# Patient Record
Sex: Female | Born: 1995 | Race: Black or African American | Hispanic: No | Marital: Single | Smoking: Never smoker
Health system: Southern US, Community
[De-identification: ages and names within clinical notes are randomized; demographics above are authoritative.]

## PROBLEM LIST (undated history)

## (undated) DIAGNOSIS — G43909 Migraine, unspecified, not intractable, without status migrainosus: Secondary | ICD-10-CM

## (undated) DIAGNOSIS — G510 Bell's palsy: Secondary | ICD-10-CM

## (undated) HISTORY — DX: Migraine, unspecified, not intractable, without status migrainosus: G43.909

## (undated) HISTORY — DX: Bell's palsy: G51.0

## (undated) HISTORY — PX: MANDIBLE SURGERY: SHX707

---
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
## 2017-06-25 ENCOUNTER — Ambulatory Visit: Payer: Self-pay | Admitting: Family Medicine

## 2017-06-25 NOTE — Progress Notes (Deleted)
  No chief complaint on file.   HPI  4 review of systems  No past medical history on file.  No current outpatient medications on file.   No current facility-administered medications for this visit.     Allergies: Allergies not on file  *** The histories are not reviewed yet. Please review them in the "History" navigator section and refresh this SmartLink.  Social History   Socioeconomic History  . Marital status: Single    Spouse name: Not on file  . Number of children: Not on file  . Years of education: Not on file  . Highest education level: Not on file  Social Needs  . Financial resource strain: Not on file  . Food insecurity - worry: Not on file  . Food insecurity - inability: Not on file  . Transportation needs - medical: Not on file  . Transportation needs - non-medical: Not on file  Occupational History  . Not on file  Tobacco Use  . Smoking status: Not on file  Substance and Sexual Activity  . Alcohol use: Not on file  . Drug use: Not on file  . Sexual activity: Not on file  Other Topics Concern  . Not on file  Social History Narrative  . Not on file    No family history on file.   ROS Review of Systems See HPI Constitution: No fevers or chills No malaise No diaphoresis Skin: No rash or itching Eyes: no blurry vision, no double vision GU: no dysuria or hematuria Neuro: no dizziness or headaches * all others reviewed and negative   Objective: There were no vitals filed for this visit.  Physical Exam  Assessment and Plan There are no diagnoses linked to this encounter.   Laura Potter 

## 2017-06-28 ENCOUNTER — Ambulatory Visit: Payer: Self-pay | Admitting: Family Medicine

## 2018-03-27 ENCOUNTER — Ambulatory Visit (INDEPENDENT_AMBULATORY_CARE_PROVIDER_SITE_OTHER): Payer: BLUE CROSS/BLUE SHIELD | Admitting: Neurology

## 2018-03-27 ENCOUNTER — Encounter: Payer: Self-pay | Admitting: Neurology

## 2018-03-27 VITALS — BP 132/90 | HR 75 | Ht 73.0 in | Wt 263.0 lb

## 2018-03-27 DIAGNOSIS — R5382 Chronic fatigue, unspecified: Secondary | ICD-10-CM

## 2018-03-27 DIAGNOSIS — R29810 Facial weakness: Secondary | ICD-10-CM | POA: Diagnosis not present

## 2018-03-27 DIAGNOSIS — G519 Disorder of facial nerve, unspecified: Secondary | ICD-10-CM

## 2018-03-27 DIAGNOSIS — G510 Bell's palsy: Secondary | ICD-10-CM | POA: Diagnosis not present

## 2018-03-27 NOTE — Progress Notes (Signed)
[facility]    Provider:  Dr Lucia Gaskins Referring Provider: Jodell Cipro, NP Primary Care Physician:  Jodell Cipro, NP  CC:  Recurrent bells palsy  HPI:  Laura Potter is a 22 y.o. female here as requested by Dr. Darrelyn Hillock for Bell's palsy. The first time she looked in the mirror and her face wasn't moving on the right side 2017. Treated with prednisone and lasted 6 weeks. 2nd time in 2018 she had it on the left side. Diagnosed and treated with steroids and lasted about 6 weeks. On September 2nd she was brushing her teeth and her tongue felt numb, again had Bell's Palsy on the right side. Before the incidents, she felt rundown but always run down, she works full time and is a Consulting civil engineer, no known illnesses this time, she has decreased taste, no hearing difficulties, she is having dry eye on the right. But she can close the right eye she doesn't have to wear a patch. The first 2 she felt recovered, this is taking longer to resolve, she took short-term disability from work and she is stressed out. She massages her face. No FHx of autoimmune disorders or neurologic disorders. She never gets mouth blisters.   Reviewed notes, labs and imaging from outside physicians, which showed:  Will request notes from pcp, not provided   Review of Systems: Patient complains of symptoms per HPI as well as the following symptoms: depression, racing thoughts. Pertinent negatives and positives per HPI. All others negative.   Social History   Socioeconomic History  . Marital status: Single    Spouse name: Not on file  . Number of children: 0  . Years of education: 60  . Highest education level: Some college, no degree  Occupational History  . Not on file  Social Needs  . Financial resource strain: Not on file  . Food insecurity:    Worry: Not on file    Inability: Not on file  . Transportation needs:    Medical: Not on file    Non-medical: Not on file  Tobacco Use  . Smoking  status: Never Smoker  . Smokeless tobacco: Never Used  Substance and Sexual Activity  . Alcohol use: Yes    Alcohol/week: 3.0 - 4.0 standard drinks    Types: 3 - 4 Glasses of wine per week  . Drug use: Yes    Frequency: 7.0 times per week    Types: Marijuana  . Sexual activity: Yes    Partners: Female    Birth control/protection: None  Lifestyle  . Physical activity:    Days per week: Not on file    Minutes per session: Not on file  . Stress: Not on file  Relationships  . Social connections:    Talks on phone: Not on file    Gets together: Not on file    Attends religious service: Not on file    Active member of club or organization: Not on file    Attends meetings of clubs or organizations: Not on file    Relationship status: Not on file  . Intimate partner violence:    Fear of current or ex partner: Not on file    Emotionally abused: Not on file    Physically abused: Not on file    Forced sexual activity: Not on file  Other Topics Concern  . Not on file  Social History Narrative   Lives at home with her rabbit pickles   Right handed   Caffeine:  rarely    Family History  Problem Relation Age of Onset  . Diabetes Mother   . High blood pressure Mother   . High blood pressure Father   . High blood pressure Maternal Grandmother   . Breast cancer Maternal Grandmother   . Stroke Maternal Grandmother   . Diabetes Maternal Grandfather   . High blood pressure Maternal Grandfather   . Diabetes Paternal Grandmother   . High blood pressure Paternal Grandmother   . Stroke Paternal Grandmother   . Migraines Paternal Grandmother   . Diabetes Paternal Grandfather   . High blood pressure Paternal Grandfather   . Breast cancer Other   . Bell's palsy Neg Hx     Past Medical History:  Diagnosis Date  . Bell's palsy   . Migraines     Past Surgical History:  Procedure Laterality Date  . MANDIBLE SURGERY     for underbite    No current outpatient medications on file.     No current facility-administered medications for this visit.     Allergies as of 03/27/2018  . (Not on File)    Vitals: BP 132/90 (BP Location: Right Arm, Patient Position: Sitting)   Pulse 75   Ht 6\' 1"  (1.854 m)   Wt 263 lb (119.3 kg)   LMP 02/23/2018 (Within Days)   BMI 34.70 kg/m  Last Weight:  Wt Readings from Last 1 Encounters:  03/27/18 263 lb (119.3 kg)   Last Height:   Ht Readings from Last 1 Encounters:  03/27/18 6\' 1"  (1.854 m)   Physical exam: Exam: Gen: NAD, conversant, well nourised, obese, well groomed                     CV: RRR, no MRG. No Carotid Bruits. No peripheral edema, warm, nontender Eyes: Conjunctivae clear without exudates or hemorrhage  Neuro: Detailed Neurologic Exam  Speech:    Speech is normal; fluent and spontaneous with normal comprehension.  Cognition:    The patient is oriented to person, place, and time;     recent and remote memory intact;     language fluent;     normal attention, concentration,     fund of knowledge Cranial Nerves:    The pupils are equal, round, and reactive to light. The fundi are normal and spontaneous venous pulsations are present. Visual fields are full to finger confrontation. Extraocular movements are intact. Trigeminal sensation is intact and the muscles of mastication are normal. Decreased blink on the right, mild right facial palsy and mild right decrease brow lift, retracted right lid. Decreased right eye closure. The palate elevates in the midline. Hearing intact. Voice is normal. Shoulder shrug is normal. The tongue has normal motion without fasciculations.   Coordination:    Normal finger to nose and heel to shin. Normal rapid alternating movements.   Gait:    Heel-toe and tandem gait are normal.   Motor Observation:    No asymmetry, no atrophy, and no involuntary movements noted. Tone:    Normal muscle tone.    Posture:    Posture is normal. normal erect    Strength:    Strength is V/V  in the upper and lower limbs.      Sensation: intact to LT     Reflex Exam:  DTR's:    Deep tendon reflexes in the upper and lower extremities are normal bilaterally.   Toes:    The toes are downgoing bilaterally.   Clonus:  Clonus is absent.       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 Bells palsy 3 time in 3 years, currently on the right but has been on the left in the past.   Very unusual case.  Bell's palsy recurs in less than 10% of patients in this patient has had Bell's palsy 3 times in 3 years.  No family history of Bell's palsy.  Causes that predisposed to recurrence of idiopathic facial palsy or not well known however associated with causes such as malignant hypertension, diabetes, autoimmune disorders, pregnancy, fibrous dysplasia, Melkerson Rosenthal syndrome. Will order MRI of the brain w/wo contrast to evaluate for causes and lab testing as well.   Orders Placed This Encounter  Procedures  . MR BRAIN W WO CONTRAST  . Angiotensin converting enzyme  . HIV Antibody (routine testing w rflx)  . RPR  . B. burgdorfi Antibody  . ANA, IFA (with reflex)  . Hemoglobin A1c  . Sjogren's syndrome antibods(ssa + ssb)  . HSV(herpes simplex vrs) 1+2 ab-IgG  . CBC  . Comprehensive metabolic panel  . TSH   RTC 3 months  Cc: Afatsawo, Christinia Gully, NP  Naomie Dean, MD  [facility] [address] [place], [place] [address]  Phone [phone] Fax [phone]

## 2018-03-27 NOTE — Patient Instructions (Signed)
MRI of the brain Blood work today Return in 2-3 months   Bell Palsy, Adult Bell palsy is a short-term inability to move muscles in part of the face. The inability to move (paralysis) results from inflammation or compression of the facial nerve, which travels along the skull and under the ear to the side of the face (7th cranial nerve). This nerve is responsible for facial movements that include blinking, closing the eyes, smiling, and frowning. What are the causes? The exact cause of this condition is not known. It may be caused by an infection from a virus, such as the chickenpox (herpes zoster), Epstein-Barr, or mumps virus. What increases the risk? You are more likely to develop this condition if:  You are pregnant.  You have diabetes.  You have had a recent infection in your nose, throat, or airways (upper respiratory infection).  You have a weakened body defense system (immune system).  You have had a facial injury, such as a fracture.  You have a family history of Bell palsy.  What are the signs or symptoms? Symptoms of this condition include:  Weakness on one side of the face.  Drooping eyelid and corner of the mouth.  Excessive tearing in one eye.  Difficulty closing the eyelid.  Dry eye.  Drooling.  Dry mouth.  Changes in taste.  Change in facial appearance.  Pain behind one ear.  Ringing in one or both ears.  Sensitivity to sound in one ear.  Facial twitching.  Headache.  Impaired speech.  Dizziness.  Difficulty eating or drinking.  Most of the time, only one side of the face is affected. Rarely, Bell palsy affects the whole face. How is this diagnosed? This condition is diagnosed based on:  Your symptoms.  Your medical history.  A physical exam.  You may also have to see health care providers who specialize in disorders of the nerves (neurologist) or diseases and conditions of the eye (ophthalmologist). You may have tests, such  as:  A test to check for nerve damage (electromyogram).  Imaging studies, such as CT or MRI scans.  Blood tests.  How is this treated? This condition affects every person differently. Sometimes symptoms go away without treatment within a couple weeks. If treatment is needed, it varies from person to person. The goal of treatment is to reduce inflammation and protect the eye from damage. Treatment for Bell palsy may include:  Medicines, such as: ? Steroids to reduce swelling and inflammation. ? Antiviral drugs. ? Pain relievers, including aspirin, acetaminophen, or ibuprofen.  Eye drops or ointment to keep your eye moist.  Eye protection, if you cannot close your eye.  Exercises or massage to regain muscle strength and function (physical therapy).  Follow these instructions at home:  Take over-the-counter and prescription medicines only as told by your health care provider.  If your eye is affected: ? Keep your eye moist with eye drops or ointment as told by your health care provider. ? Follow instructions for eye care and protection as told by your health care provider.  Do any physical therapy exercises as told by your health care provider.  Keep all follow-up visits as told by your health care provider. This is important. Contact a health care provider if:  You have a fever.  Your symptoms do not get better within 2-3 weeks, or your symptoms get worse.  Your eye is red, irritated, or painful.  You have new symptoms. Get help right away if:  You have  weakness or numbness in a part of your body other than your face.  You have trouble swallowing.  You develop neck pain or stiffness.  You develop dizziness or shortness of breath. Summary  Bell palsy is a short-term inability to move muscles in part of the face. The inability to move (paralysis) results from inflammation or compression of the facial nerve.  This condition affects every person differently. Sometimes  symptoms go away without treatment within a couple weeks.  If treatment is needed, it varies from person to person. The goal of treatment is to reduce inflammation and protect the eye from damage.  Contact your health care provider if your symptoms do not get better within 2-3 weeks, or your symptoms get worse. This information is not intended to replace advice given to you by your health care provider. Make sure you discuss any questions you have with your health care provider. Document Released: 05/01/2005 Document Revised: 07/04/2016 Document Reviewed: 07/04/2016 Elsevier Interactive Patient Education  [employer].

## 2018-03-28 ENCOUNTER — Encounter: Payer: Self-pay | Admitting: Neurology

## 2018-03-28 ENCOUNTER — Telehealth: Payer: Self-pay | Admitting: Neurology

## 2018-03-28 DIAGNOSIS — G510 Bell's palsy: Secondary | ICD-10-CM | POA: Insufficient documentation

## 2018-03-28 NOTE — Telephone Encounter (Signed)
unable to lvm the phone number it did not have a vmail box  [employer] Auth: [id number] (exp. 03/27/18 to 04/25/18)

## 2018-03-29 LAB — CBC
Hematocrit: 45.4 % (ref 34.0–46.6)
Hemoglobin: 15.1 g/dL (ref 11.1–15.9)
MCH: 28 pg (ref 26.6–33.0)
MCHC: 33.3 g/dL (ref 31.5–35.7)
MCV: 84 fL (ref 79–97)
PLATELETS: 311 10*3/uL (ref 150–450)
RBC: 5.4 x10E6/uL — AB (ref 3.77–5.28)
RDW: 14.3 % (ref 12.3–15.4)
WBC: 7.8 10*3/uL (ref 3.4–10.8)

## 2018-03-29 LAB — B. BURGDORFI ANTIBODIES

## 2018-03-29 LAB — COMPREHENSIVE METABOLIC PANEL
ALT: 124 IU/L — AB (ref 0–32)
AST: 63 IU/L — ABNORMAL HIGH (ref 0–40)
Albumin/Globulin Ratio: 2.1 (ref 1.2–2.2)
Albumin: 4.9 g/dL (ref 3.5–5.5)
Alkaline Phosphatase: 62 IU/L (ref 39–117)
BUN / CREAT RATIO: 10 (ref 9–23)
BUN: 8 mg/dL (ref 6–20)
Bilirubin Total: 1 mg/dL (ref 0.0–1.2)
CO2: 19 mmol/L — ABNORMAL LOW (ref 20–29)
Calcium: 9.9 mg/dL (ref 8.7–10.2)
Chloride: 108 mmol/L — ABNORMAL HIGH (ref 96–106)
Creatinine, Ser: 0.77 mg/dL (ref 0.57–1.00)
GFR calc non Af Amer: 108 mL/min/{1.73_m2} (ref >59)
GFR, EST AFRICAN AMERICAN: 125 mL/min/{1.73_m2} (ref >59)
Globulin, Total: 2.3 g/dL (ref 1.5–4.5)
Glucose: 62 mg/dL — ABNORMAL LOW (ref 65–99)
POTASSIUM: 4.5 mmol/L (ref 3.5–5.2)
Sodium: 144 mmol/L (ref 134–144)
TOTAL PROTEIN: 7.2 g/dL (ref 6.0–8.5)

## 2018-03-29 LAB — SJOGREN'S SYNDROME ANTIBODS(SSA + SSB)
ENA SSA (RO) Ab: 0.7 AI (ref 0.0–0.9)
ENA SSB (LA) Ab: 5.8 AI — ABNORMAL HIGH (ref 0.0–0.9)

## 2018-03-29 LAB — HSV(HERPES SIMPLEX VRS) I + II AB-IGG
HSV 1 Glycoprotein G Ab, IgG: <0.91 index (ref 0.00–0.90)
HSV 2 IgG, Type Spec: 11 index — ABNORMAL HIGH (ref 0.00–0.90)

## 2018-03-29 LAB — ANTINUCLEAR ANTIBODIES, IFA: ANA Titer 1: POSITIVE — AB

## 2018-03-29 LAB — RPR: RPR Ser Ql: NONREACTIVE

## 2018-03-29 LAB — FANA STAINING PATTERNS

## 2018-03-29 LAB — ANGIOTENSIN CONVERTING ENZYME: Angio Convert Enzyme: 24 U/L (ref 14–82)

## 2018-03-29 LAB — HEMOGLOBIN A1C
Est. average glucose Bld gHb Est-mCnc: 108 mg/dL
Hgb A1c MFr Bld: 5.4 % (ref 4.8–5.6)

## 2018-03-29 LAB — TSH: TSH: 1.09 u[IU]/mL (ref 0.450–4.500)

## 2018-03-29 LAB — HIV ANTIBODY (ROUTINE TESTING W REFLEX): HIV SCREEN 4TH GENERATION: NONREACTIVE

## 2018-04-01 ENCOUNTER — Other Ambulatory Visit: Payer: Self-pay | Admitting: *Deleted

## 2018-04-01 ENCOUNTER — Telehealth: Payer: Self-pay | Admitting: Neurology

## 2018-04-01 DIAGNOSIS — G510 Bell's palsy: Secondary | ICD-10-CM

## 2018-04-01 DIAGNOSIS — R768 Other specified abnormal immunological findings in serum: Secondary | ICD-10-CM

## 2018-04-01 NOTE — Telephone Encounter (Signed)
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 I discussed this with rheumatology and Dr. Corliss Skainseveshwar has agreed to see patient.  If paiemt agrees then place a referral to shali deveshwar for ana positive and bells palsy.  Also, she needs to see a primary care physician within 4-8 weeks or around there as her liver enzymes are elevated, she needs a thorough evaluation and she should stop any excessive tylenol use or cut back on alcohol use or anything that affects the liver.    Also, she has not scheuled her MRI of the brain yet. Ask if there has been an issue with that, I highly recommend it. Thanks.  Dr. Lucia GaskinsAhern

## 2018-04-01 NOTE — Telephone Encounter (Signed)
Rheumatology referral placed

## 2018-04-01 NOTE — Telephone Encounter (Signed)
Called pt and discussed all of the results and suggestions from Dr. Lucia GaskinsAhern in detail. Pt verbalized understanding and her questions were answered. She agreed to the referral. She is aware that referrals has tried to reach out to schedule MRI. Her phone was off but is now. Unable to schedule pt right now but [facility] Pt verbalized appreciation.

## 2018-04-02 NOTE — Telephone Encounter (Signed)
Spoke to the patient she is scheduled for 04/09/18 at [facility].  °

## 2018-04-02 NOTE — Telephone Encounter (Signed)
[facility] to the patient [facility]

## 2018-04-04 NOTE — Progress Notes (Signed)
Office Visit Note  Patient: Laura Potter             Date of Birth: 03/14/1996           MRN: [id number]             PCP: Jodell Cipro, NP Referring: Anson Fret, MD Visit Date: 04/17/2018 Occupation: [employer], [employer]  Subjective:  Recurrent Bell's palsy.   History of Present Illness: Laura Potter is a 22 y.o. female seen in consultation per request of Dr. Daisy Blossom.  According to patient her symptoms are started in June 2017 with right-sided Bell's palsy.  She states she initially was seen at the emergency room where she was given prednisone and the symptoms lasted for about 6 weeks.  A year later she had recurrence of symptoms on the left side with left-sided Bell's palsy at the time she did not see a physician and the symptoms resolved after 8 weeks.  On January 15, 2018 she states she started experiencing some paresthesias in her tongue and then eventually developed right-sided Bell's palsy.  She went to her PCP and was given prednisone and Vancyclovir.  She was also referred to Dr. Daisy Blossom.  Patient states Dr. Daisy Blossom did lab work and MRI of her brain.  The MRI of the brain was unremarkable.  Her labs came positive for ANA low titer and positive SSB.  Denies any history of dry mouth or dry eyes.  There is no history of rainouts phenomenon there is no history of joint pain.  She states she has some knee joint discomfort which she relates to her weight.  Activities of Daily Living:  Patient reports morning stiffness for 0 minute.   Patient Denies nocturnal pain.  Difficulty dressing/grooming: Denies Difficulty climbing stairs: Denies Difficulty getting out of chair: Denies Difficulty using hands for taps, buttons, cutlery, and/or writing: Denies  Review of Systems  Constitutional: Negative for fatigue, night sweats, weight gain and weight loss.  HENT: Negative for mouth sores, trouble swallowing, trouble swallowing, mouth dryness and nose  dryness.   Eyes: Negative for pain, redness, visual disturbance and dryness.  Respiratory: Negative for cough, shortness of breath and difficulty breathing.   Cardiovascular: Negative for chest pain, palpitations, hypertension, irregular heartbeat and swelling in legs/feet.  Gastrointestinal: Negative for blood in stool, constipation and diarrhea.  Endocrine: Negative for increased urination.  Genitourinary: Negative for vaginal dryness.  Musculoskeletal: Negative for arthralgias, joint pain, joint swelling, myalgias, muscle weakness, morning stiffness, muscle tenderness and myalgias.  Skin: Negative for color change, rash, hair loss, skin tightness, ulcers and sensitivity to sunlight.  Allergic/Immunologic: Negative for susceptible to infections.  Neurological: Positive for headaches. Negative for dizziness, memory loss, night sweats and weakness.       History of occasional migraines.  Hematological: Negative for swollen glands.  Psychiatric/Behavioral: Negative for depressed mood and sleep disturbance. The patient is nervous/anxious.     PMFS History:  Patient Active Problem List   Diagnosis Date Noted  . Hx of migraines 04/17/2018  . Facial paralysis/Bells palsy 03/28/2018    Past Medical History:  Diagnosis Date  . Bell's palsy   . Migraines     Family History  Problem Relation Age of Onset  . Diabetes Mother   . High blood pressure Mother   . High blood pressure Father   . High blood pressure Maternal Grandmother   . Breast cancer Maternal Grandmother   . Stroke Maternal Grandmother   . Diabetes  Maternal Grandfather   . High blood pressure Maternal Grandfather   . Diabetes Paternal Grandmother   . High blood pressure Paternal Grandmother   . Stroke Paternal Grandmother   . Migraines Paternal Grandmother   . Diabetes Paternal Grandfather   . High blood pressure Paternal Grandfather   . Breast cancer Other   . Healthy Brother   . Asthma Brother   . Healthy Brother     . Healthy Brother   . Bell's palsy Neg Hx    Past Surgical History:  Procedure Laterality Date  . MANDIBLE SURGERY     for underbite  . WISDOM TOOTH EXTRACTION  2012   Social History   Social History Narrative   Lives at home with her rabbit pickles   Right handed   Caffeine: rarely    Objective: Vital Signs: BP (!) 134/92 (BP Location: Left Arm, Patient Position: Sitting, Cuff Size: Normal)   Pulse 84   Resp 12   Ht 6\' 1"  (1.854 m)   Wt 259 lb (117.5 kg)   BMI 34.17 kg/m    Physical Exam  Constitutional: She is oriented to person, place, and time. She appears well-developed and well-nourished.  HENT:  Head: Normocephalic and atraumatic.  Eyes: Conjunctivae and EOM are normal.  Neck: Normal range of motion.  Cardiovascular: Normal rate, regular rhythm, normal heart sounds and intact distal pulses.  Pulmonary/Chest: Effort normal and breath sounds normal.  Abdominal: Soft. Bowel sounds are normal.  Lymphadenopathy:    She has no cervical adenopathy.  Neurological: She is alert and oriented to person, place, and time. A cranial nerve deficit is present.  Right sided Bell's palsy  Skin: Skin is warm and dry. Capillary refill takes less than 2 seconds.  Psychiatric: She has a normal mood and affect. Her behavior is normal.  Nursing note and vitals reviewed.    Musculoskeletal Exam: C-spine thoracic lumbar spine good range of motion.  Shoulder joints elbow joints wrist joint MCPs PIPs DIPs been good range of motion with no synovitis.  Hip joints knee joints ankles MTPs PIPs were in good range of motion with no synovitis.  CDAI Exam: CDAI Score: Not documented Patient Global Assessment: Not documented; Provider Global Assessment: Not documented Swollen: Not documented; Tender: Not documented Joint Exam   Not documented   There is currently no information documented on the homunculus. Go to the Rheumatology activity and complete the homunculus joint  exam.  Investigation: Findings:  03/27/18: ANA 1:160 speckled, Ro-, La 5.8, ACE 24, RPR-, HIV-, Lyme ab-, TSH 1.090, HSV1 -, HSV2 11+, HgbA1c 5.4  Component     Latest Ref Rng & Units 03/27/2018  Hemoglobin A1C     4.8 - 5.6 % 5.4  Est. average glucose Bld gHb Est-mCnc     mg/dL 604  ENA SSA (RO) Ab     0.0 - 0.9 AI 0.7  ENA SSB (LA) Ab     0.0 - 0.9 AI 5.8 (H)  HSV 1 Glycoprotein G Ab, IgG     0.00 - 0.90 index <0.91  HSV 2 IGG,TYPE SPECIFIC AB     0.00 - 0.90 index 11.00 (H)  Angio Convert Enzyme     14 - 82 U/L 24  HIV Screen 4th Generation wRfx     Non Reactive Non Reactive  RPR     Non Reactive Non Reactive  Lyme IgG/IgM Ab     0.00 - 0.90 ISR <0.91  ANA Titer 1  Positive (A)   Ima Component     Latest Ref Rng & Units 03/27/2018  Speckled Pattern      1:160 (H)  NOTE:      Comment  TSH     0.450 - 4.500 uIU/mL 1.090  ging: Mr Laqueta Jean Wo Contrast  Result Date: 04/09/2018  [facility] [address], [address] [place], [place] [address] [phone] NEUROIMAGING REPORT STUDY DATE: 04/09/2018 PATIENT NAME: Allona Gondek DOB: 03-03-1996 MRN: [id number] EXAM: MRI Brain with and without contrast ORDERING CLINICIAN: Naomie Dean, MD CLINICAL HISTORY: 22 year old woman with recurrent Bell's palsy currently on the right COMPARISON FILMS: None TECHNIQUE:MRI of the brain with and without contrast was obtained utilizing 5 mm axial slices with T1, T2, T2 flair, heme weighted and diffusion weighted views.  T1 sagittal, T2 coronal and postcontrast views in the axial and coronal plane were obtained.  Additional thin section axial CISS, axial T1, coronal T1 and postcontrast axial and coronal T1-weighted images were performed through the internal auditory canals. CONTRAST: 20 ml Multihance IMAGING SITE: [facility], [address], [place], [place] [address] FINDINGS: On sagittal images, the spinal cord is imaged caudally to C3 and is normal in  caliber.   The contents of the posterior fossa are of normal size and position.   The pituitary gland and optic chiasm appear normal.    Brain volume appears normal.   The ventricles are normal in size and without distortion.  There are no abnormal extra-axial collections of fluid.  The cerebellum and brainstem appears normal.   The deep gray matter appears normal.  The cerebral hemispheres appear normal.   Diffusion weighted images are normal.  Heme weighted images are normal.   The orbits appear normal.   The VIIth/VIIIth nerve complex appears normal.  The mastoid air cells appear normal.  There is a small mucous retention cyst in the right maxillary sinus.  The other paranasal sinuses appear normal.  Flow voids are identified within the major intracerebral arteries.   Cerebral veins and dural sinuses appear normal. After the infusion of contrast material, a normal enhancement pattern is noted.   This is a normal MRI of the brain with and without contrast with added attention to the internal auditory canals INTERPRETING PHYSICIAN: Richard A. Epimenio Foot, MD, PhD, FAAN Certified in  Neuroimaging by [employer]    Recent Labs: Lab Results  Component Value Date   WBC 7.8 03/27/2018   HGB 15.1 03/27/2018   PLT 311 03/27/2018   NA 144 03/27/2018   K 4.5 03/27/2018   CL 108 (H) 03/27/2018   CO2 19 (L) 03/27/2018   GLUCOSE 62 (L) 03/27/2018   BUN 8 03/27/2018   CREATININE 0.77 03/27/2018   BILITOT 1.0 03/27/2018   ALKPHOS 62 03/27/2018   AST 63 (H) 03/27/2018   ALT 124 (H) 03/27/2018   PROT 7.2 03/27/2018   ALBUMIN 4.9 03/27/2018   CALCIUM 9.9 03/27/2018   GFRAA 125 03/27/2018    Speciality Comments: No specialty comments available.  Procedures:  No procedures performed Allergies: Patient has no known allergies.   Assessment / Plan:     Visit Diagnoses: Positive ANA (antinuclear antibody) -patient has positive ANA and positive SSB antibody.  She has no sicca symptoms.   There is no history of Raynolds or any other autoimmune symptoms.  I would like to repeat her labs at the follow-up visit.  03/27/18: ANA 1:160 speckled, Ro-, La 5.8, ACE 24, RPR-, HIV-, Lyme ab-,  TSH 1.090, HSV1 -, HSV2 11+, HgbA1c 5.4.  I would like to obtainAVISE labs at her follow-up visit.  Facial paralysis/Bells palsy-patient had one episode of Bell's palsy once a year x3 so far.  Each episode responded to the prednisone and acyclovir.  Am uncertain if her positive ANA and positive SSB antibodies are contributing to Bell's palsy.  She has no parotid swelling.  I will try to contact Dr. Lucia GaskinsAhern and discuss this further with her.  HSV-2 IgG antibody positive-I have advised her to follow-up with her PCP regarding this.  She is currently asymptomatic.  Hx of migraines-patient gives history of occasional migraines.  Elevated LFTs - History of alcohol intake.  Abstinence from alcohol was discussed.  I have also advised her to follow-up with her PCP for repeat LFTs and see if they will improve coming off alcohol.  She states she has not been taking any NSAIDs.  Orders: No orders of the defined types were placed in this encounter.  No orders of the defined types were placed in this encounter.   Face-to-face time spent with patient was 45 minutes. Greater than 50% of time was spent in counseling and coordination of care.  Follow-Up Instructions: No follow-ups on file.   Pollyann SavoyShaili Lygia Olaes, MD  Note - This record has been created using [employer] software.  Chart creation errors have been sought, but may not always  have been located. Such creation errors do not reflect on  the standard of medical care.

## 2018-04-09 ENCOUNTER — Encounter: Payer: Self-pay | Admitting: Neurology

## 2018-04-09 ENCOUNTER — Ambulatory Visit: Payer: BLUE CROSS/BLUE SHIELD

## 2018-04-09 DIAGNOSIS — G519 Disorder of facial nerve, unspecified: Secondary | ICD-10-CM | POA: Diagnosis not present

## 2018-04-09 DIAGNOSIS — R29810 Facial weakness: Secondary | ICD-10-CM

## 2018-04-09 DIAGNOSIS — G510 Bell's palsy: Secondary | ICD-10-CM

## 2018-04-09 MED ORDER — GADOBENATE DIMEGLUMINE 529 MG/ML IV SOLN
20.0000 mL | Freq: Once | INTRAVENOUS | Status: AC | PRN
  Administered 2018-04-09: 20 mL via INTRAVENOUS

## 2018-04-10 ENCOUNTER — Telehealth: Payer: Self-pay

## 2018-04-10 NOTE — Telephone Encounter (Signed)
Spoke with the patient and she verbalized understanding her results. No questions or concerns at this time.   

## 2018-04-10 NOTE — Telephone Encounter (Signed)
-----   Message from Bertram SavinBethany L Culbertson, RN sent at 04/10/2018  4:12 PM EST -----   ----- Message ----- From: Anson FretAhern, Antonia B, MD Sent: 04/10/2018   8:50 AM EST To: Bertram SavinBethany L Culbertson, RN  MRI of the brain is normal. She should follow up with Dr. Corliss Skainseveshwar in Rheumatology as we discussed. Come back to clinic ASAP if sh ehas another episode. thanks

## 2018-04-17 ENCOUNTER — Encounter: Payer: Self-pay | Admitting: Rheumatology

## 2018-04-17 ENCOUNTER — Ambulatory Visit: Payer: BLUE CROSS/BLUE SHIELD | Admitting: Rheumatology

## 2018-04-17 VITALS — BP 134/92 | HR 84 | Resp 12 | Ht 73.0 in | Wt 259.0 lb

## 2018-04-17 DIAGNOSIS — Z8669 Personal history of other diseases of the nervous system and sense organs: Secondary | ICD-10-CM | POA: Insufficient documentation

## 2018-04-17 DIAGNOSIS — R768 Other specified abnormal immunological findings in serum: Secondary | ICD-10-CM | POA: Diagnosis not present

## 2018-04-17 DIAGNOSIS — G510 Bell's palsy: Secondary | ICD-10-CM | POA: Diagnosis not present

## 2018-04-17 DIAGNOSIS — R7989 Other specified abnormal findings of blood chemistry: Secondary | ICD-10-CM

## 2018-04-17 DIAGNOSIS — R945 Abnormal results of liver function studies: Secondary | ICD-10-CM | POA: Diagnosis not present

## 2018-05-07 ENCOUNTER — Emergency Department (HOSPITAL_COMMUNITY): Payer: BLUE CROSS/BLUE SHIELD

## 2018-05-07 ENCOUNTER — Other Ambulatory Visit: Payer: Self-pay

## 2018-05-07 ENCOUNTER — Emergency Department (HOSPITAL_COMMUNITY)
Admission: EM | Admit: 2018-05-07 | Discharge: 2018-05-07 | Disposition: A | Discharge status: Home / Self Care | Payer: BLUE CROSS/BLUE SHIELD | Attending: Emergency Medicine | Admitting: Emergency Medicine

## 2018-05-07 ENCOUNTER — Encounter (HOSPITAL_COMMUNITY): Payer: Self-pay

## 2018-05-07 DIAGNOSIS — Y92009 Unspecified place in unspecified non-institutional (private) residence as the place of occurrence of the external cause: Secondary | ICD-10-CM | POA: Insufficient documentation

## 2018-05-07 DIAGNOSIS — S0592XA Unspecified injury of left eye and orbit, initial encounter: Secondary | ICD-10-CM

## 2018-05-07 DIAGNOSIS — Y9389 Activity, other specified: Secondary | ICD-10-CM | POA: Diagnosis not present

## 2018-05-07 DIAGNOSIS — Z23 Encounter for immunization: Secondary | ICD-10-CM | POA: Insufficient documentation

## 2018-05-07 DIAGNOSIS — Y999 Unspecified external cause status: Secondary | ICD-10-CM | POA: Diagnosis not present

## 2018-05-07 DIAGNOSIS — R51 Headache: Secondary | ICD-10-CM | POA: Diagnosis not present

## 2018-05-07 DIAGNOSIS — S51859A Open bite of unspecified forearm, initial encounter: Secondary | ICD-10-CM | POA: Insufficient documentation

## 2018-05-07 DIAGNOSIS — W503XXA Accidental bite by another person, initial encounter: Secondary | ICD-10-CM

## 2018-05-07 LAB — POC URINE PREG, ED: Preg Test, Ur: NEGATIVE

## 2018-05-07 MED ORDER — TETANUS-DIPHTH-ACELL PERTUSSIS 5-2.5-18.5 LF-MCG/0.5 IM SUSP
0.5000 mL | Freq: Once | INTRAMUSCULAR | Status: AC
  Administered 2018-05-07: 0.5 mL via INTRAMUSCULAR
  Filled 2018-05-07: qty 0.5

## 2018-05-07 MED ORDER — TETRACAINE HCL 0.5 % OP SOLN
1.0000 [drp] | Freq: Once | OPHTHALMIC | Status: AC
  Administered 2018-05-07: 1 [drp] via OPHTHALMIC
  Filled 2018-05-07: qty 4

## 2018-05-07 MED ORDER — AMOXICILLIN-POT CLAVULANATE 875-125 MG PO TABS: 1.0000 | ORAL_TABLET | Freq: Two times a day (BID) | ORAL | 0 refills | Status: AC

## 2018-05-07 MED ORDER — FLUORESCEIN SODIUM 1 MG OP STRP
1.0000 | ORAL_STRIP | Freq: Once | OPHTHALMIC | Status: AC
  Administered 2018-05-07: 1 via OPHTHALMIC
  Filled 2018-05-07: qty 1

## 2018-05-07 MED ORDER — ACETAMINOPHEN 325 MG PO TABS
650.0000 mg | ORAL_TABLET | Freq: Once | ORAL | Status: AC
  Administered 2018-05-07: 650 mg via ORAL
  Filled 2018-05-07: qty 2

## 2018-05-07 MED ORDER — AMOXICILLIN-POT CLAVULANATE 875-125 MG PO TABS: 1.0000 | ORAL_TABLET | Freq: Two times a day (BID) | ORAL | 0 refills | Status: DC
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
## 2018-05-07 NOTE — ED Notes (Signed)
Visual acuity screening Without corrective lenses (Patient usually wears glasses):  Right eye: 20/30 Left eye: 20/40 Bilateral eyes: 20/30
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
## 2018-05-24 ENCOUNTER — Encounter (HOSPITAL_COMMUNITY): Payer: Self-pay | Admitting: Emergency Medicine

## 2018-05-24 ENCOUNTER — Emergency Department (HOSPITAL_COMMUNITY)
Admission: EM | Admit: 2018-05-24 | Discharge: 2018-05-25 | Disposition: A | Discharge status: Home / Self Care | Payer: BLUE CROSS/BLUE SHIELD | Attending: Emergency Medicine | Admitting: Emergency Medicine

## 2018-05-24 ENCOUNTER — Other Ambulatory Visit: Payer: Self-pay

## 2018-05-24 DIAGNOSIS — J101 Influenza due to other identified influenza virus with other respiratory manifestations: Secondary | ICD-10-CM | POA: Insufficient documentation

## 2018-05-24 LAB — I-STAT CHEM 8, ED
BUN: 9 mg/dL (ref 6–20)
CREATININE: 0.9 mg/dL (ref 0.44–1.00)
Calcium, Ion: 1.16 mmol/L (ref 1.15–1.40)
Chloride: 104 mmol/L (ref 98–111)
Glucose, Bld: 92 mg/dL (ref 70–99)
HCT: 51 % — ABNORMAL HIGH (ref 36.0–46.0)
Hemoglobin: 17.3 g/dL — ABNORMAL HIGH (ref 12.0–15.0)
Potassium: 3.2 mmol/L — ABNORMAL LOW (ref 3.5–5.1)
Sodium: 139 mmol/L (ref 135–145)
TCO2: 24 mmol/L (ref 22–32)

## 2018-05-24 LAB — INFLUENZA PANEL BY PCR (TYPE A & B)
Influenza A By PCR: NEGATIVE
Influenza B By PCR: POSITIVE — AB

## 2018-05-24 MED ORDER — ONDANSETRON HCL 4 MG/2ML IJ SOLN
4.0000 mg | Freq: Once | INTRAMUSCULAR | Status: AC
  Administered 2018-05-25: 4 mg via INTRAVENOUS
  Filled 2018-05-24: qty 2

## 2018-05-24 MED ORDER — IBUPROFEN 800 MG PO TABS
800.0000 mg | ORAL_TABLET | Freq: Once | ORAL | Status: AC
  Administered 2018-05-25: 800 mg via ORAL
  Filled 2018-05-24: qty 1

## 2018-05-24 MED ORDER — SODIUM CHLORIDE 0.9 % IV SOLN
Freq: Once | INTRAVENOUS | Status: AC
  Administered 2018-05-25: 01:00:00 via INTRAVENOUS

## 2018-05-24 NOTE — ED Triage Notes (Signed)
Patient presents with flu like symptoms that she as seen for at [facility] yesterday. Patient states she has been taking OTC alkaseltzer cold, robutussin, and ibuprofen with no relief. Patient complaining of fever, malaise, N/V. Patient states 6 episodes of vomiting in 24 hours. Patient has been able to tolerate 4 saltines.  

## 2018-05-24 NOTE — ED Provider Notes (Signed)
[facility]-EMERGENCY DEPT Provider Note   CSN: [id number] Arrival date & time: 05/24/18  1925     History   Chief Complaint Chief Complaint  Patient presents with  . Flu-Like Symptoms    HPI Laura Potter is a 23 y.o. female who presents to the ED with flu like symptoms that started a few days ago. [facility]. Patient taking OTC medications without relief. Patient reports 6 episodes of vomiting in the past 24 hours. Patient initially reported no medical problems but now reports she has seen a neurologist when she had Bell's Palsy and was told she had a positive ANA and elevated liver enzymes. 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 Patient reports having upper abdominal pain.    The history is provided by the patient. No language interpreter was used.  Influenza  Presenting symptoms: cough, fatigue, fever, headache, myalgias, nausea, rhinorrhea, shortness of breath, sore throat and vomiting   Severity:  Severe Onset quality:  Gradual Duration:  4 days Progression:  Worsening Chronicity:  New Relieved by:  Nothing Worsened by:  Nothing Ineffective treatments:  OTC medications Associated symptoms: chills, decreased appetite, decreased physical activity and nasal congestion   Associated symptoms: no ear pain and no neck stiffness   Risk factors: sick contacts   Risk factors: no diabetes problem, no heart disease, no immunocompromised state and not pregnant     Past Medical History:  Diagnosis Date  . Bell's palsy   . Migraines     Patient Active Problem List   Diagnosis Date Noted  . Hx of migraines 04/17/2018  . 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 MANDIBLE SURGERY     for underbite  . WISDOM TOOTH EXTRACTION  2012     OB History   No obstetric history on file.      Home Medications    Prior  to Admission medications   Not on File    Family History Family History  Problem Relation Age of Onset  . Diabetes Mother   . High blood pressure Mother   . High blood pressure Father   . High blood pressure Maternal Grandmother   . Breast cancer Maternal Grandmother   . Stroke Maternal Grandmother   . Diabetes Maternal Grandfather   . High blood pressure Maternal Grandfather   . Diabetes Paternal Grandmother   . High blood pressure Paternal Grandmother   . Stroke Paternal Grandmother   . Migraines Paternal Grandmother   . Diabetes Paternal Grandfather   . High blood pressure Paternal Grandfather   . Breast cancer Other   . Healthy Brother   . Asthma Brother   . Healthy Brother   . Healthy Brother   . Bell's palsy Neg Hx     Social History Social History   Tobacco Use  . Smoking status: Never Smoker  . Smokeless tobacco: Never Used  Substance Use Topics  . Alcohol use: Yes    Comment: occ  . Drug use: Yes    Frequency: 7.0 times per week    Types: Marijuana     Allergies   Patient has no known allergies.   Review of Systems Review of Systems  Constitutional: Positive for chills, decreased appetite, fatigue and fever.  HENT: Positive for congestion, rhinorrhea and sore throat. Negative for ear pain.   Eyes: Negative for pain, discharge and itching.  Respiratory: Positive for cough and shortness of breath.   Gastrointestinal: Positive for nausea and vomiting. Negative for abdominal pain.  Genitourinary: Positive for decreased urine volume. Negative for dysuria, frequency, vaginal bleeding and vaginal discharge.  Musculoskeletal: Positive for myalgias. Negative for neck stiffness.  Skin: Negative for rash and wound.  Neurological: Positive for headaches. Negative for syncope.  Psychiatric/Behavioral: Negative for confusion.     Physical Exam Updated Vital Signs BP 140/90 (BP Location: Left Arm)   Pulse (!) 129   Temp 99.9 F (37.7 C) (Oral)   Resp 18    Ht 6\' 1"  (1.854 m)   Wt 106.1 kg   SpO2 96%   BMI 30.87 kg/m   Physical Exam Vitals signs and nursing note reviewed.  Constitutional:      General: She is not in acute distress.    Appearance: She is well-developed.  HENT:     Head: Normocephalic and atraumatic.     Right Ear: Tympanic membrane normal.     Left Ear: Tympanic membrane normal.     Nose: Nose normal.     Mouth/Throat:     Mouth: Mucous membranes are moist.  Eyes:     Extraocular Movements: Extraocular movements intact.     Conjunctiva/sclera: Conjunctivae normal.  Neck:     Musculoskeletal: Neck supple.  Cardiovascular:     Rate and Rhythm: Tachycardia present.  Pulmonary:     Effort: Pulmonary effort is normal. No respiratory distress.     Breath sounds: No wheezing or rales.  Abdominal:     General: Bowel sounds are normal.     Palpations: Abdomen is soft.     Tenderness: There is abdominal tenderness in the left upper quadrant and left lower quadrant.  Musculoskeletal: Normal range of motion.  Skin:    General: Skin is warm and dry.  Neurological:     Mental Status: She is alert and oriented to person, place, and time.  Psychiatric:        Mood and Affect: Mood normal.      ED Treatments / Results  Labs (all labs ordered are listed, but only abnormal results are displayed) Labs Reviewed  INFLUENZA PANEL BY PCR (TYPE A & B) - Abnormal; Notable for the following components:      Result Value   Influenza B By PCR POSITIVE (*)    All other components within normal limits  I-STAT CHEM 8, ED - Abnormal; Notable for the following components:   Potassium 3.2 (*)    Hemoglobin 17.3 (*)    HCT 51.0 (*)    All other components within normal limits  URINALYSIS, ROUTINE W REFLEX MICROSCOPIC  CBC WITH DIFFERENTIAL/PLATELET  COMPREHENSIVE METABOLIC PANEL  POC URINE PREG, ED   Radiology No results found.  Procedures Procedures (including critical care time)  Medications Ordered in ED Medications    0.9 %  sodium chloride infusion (has no administration in time range)  ondansetron (ZOFRAN) injection 4 mg (has no administration in time range)     Initial Impression / Assessment and Plan / ED Course  I have reviewed the triage vital signs and the nursing notes.   Final Clinical Impressions(s) / ED Diagnoses   Care turned over to A. Harris, PAC @ 10:30 pm. Patient appear stable for next provider to continue care.  ED Discharge Orders    None       Kerrie Buffaloeese,  [place] FirsM, [place]NP 05/24/18 2236    Samuel JesterMcManus, Kathleen, DO 05/26/18 1654

## 2018-05-25 LAB — COMPREHENSIVE METABOLIC PANEL
ALT: 54 U/L — ABNORMAL HIGH (ref 0–44)
AST: 41 U/L (ref 15–41)
Albumin: 4.9 g/dL (ref 3.5–5.0)
Alkaline Phosphatase: 48 U/L (ref 38–126)
Anion gap: 12 (ref 5–15)
BUN: 11 mg/dL (ref 6–20)
CALCIUM: 9.7 mg/dL (ref 8.9–10.3)
CO2: 26 mmol/L (ref 22–32)
Chloride: 102 mmol/L (ref 98–111)
Creatinine, Ser: 1.11 mg/dL — ABNORMAL HIGH (ref 0.44–1.00)
GFR calc non Af Amer: >60 mL/min (ref >60)
Glucose, Bld: 91 mg/dL (ref 70–99)
Potassium: 3.3 mmol/L — ABNORMAL LOW (ref 3.5–5.1)
Sodium: 140 mmol/L (ref 135–145)
Total Bilirubin: 1.1 mg/dL (ref 0.3–1.2)
Total Protein: 8.4 g/dL — ABNORMAL HIGH (ref 6.5–8.1)

## 2018-05-25 LAB — CBC WITH DIFFERENTIAL/PLATELET
Abs Immature Granulocytes: 0.05 10*3/uL (ref 0.00–0.07)
Basophils Absolute: 0 10*3/uL (ref 0.0–0.1)
Basophils Relative: 0 %
Eosinophils Absolute: 0 10*3/uL (ref 0.0–0.5)
Eosinophils Relative: 0 %
HCT: 50.2 % — ABNORMAL HIGH (ref 36.0–46.0)
Hemoglobin: 16.5 g/dL — ABNORMAL HIGH (ref 12.0–15.0)
Immature Granulocytes: 1 %
Lymphocytes Relative: 33 %
Lymphs Abs: 1.8 10*3/uL (ref 0.7–4.0)
MCH: 28.6 pg (ref 26.0–34.0)
MCHC: 32.9 g/dL (ref 30.0–36.0)
MCV: 87 fL (ref 80.0–100.0)
Monocytes Absolute: 1 10*3/uL (ref 0.1–1.0)
Monocytes Relative: 18 %
NEUTROS ABS: 2.6 10*3/uL (ref 1.7–7.7)
Neutrophils Relative %: 48 %
Platelets: 226 10*3/uL (ref 150–400)
RBC: 5.77 MIL/uL — ABNORMAL HIGH (ref 3.87–5.11)
RDW: 15.2 % (ref 11.5–15.5)
WBC: 5.4 10*3/uL (ref 4.0–10.5)
nRBC: 0 % (ref 0.0–0.2)

## 2018-05-25 LAB — URINALYSIS, ROUTINE W REFLEX MICROSCOPIC
Bilirubin Urine: NEGATIVE
Cellular Cast, UA: 3
Glucose, UA: NEGATIVE mg/dL
Hgb urine dipstick: NEGATIVE
Ketones, ur: 20 mg/dL — AB
Nitrite: NEGATIVE
PROTEIN: 100 mg/dL — AB
Specific Gravity, Urine: 1.027 (ref 1.005–1.030)
pH: 5 (ref 5.0–8.0)

## 2018-05-25 LAB — POC URINE PREG, ED: PREG TEST UR: NEGATIVE

## 2018-05-25 MED ORDER — OSELTAMIVIR PHOSPHATE 75 MG PO CAPS: 75.0000 mg | ORAL_CAPSULE | Freq: Two times a day (BID) | ORAL | 0 refills | Status: DC

## 2018-05-25 MED ORDER — OSELTAMIVIR PHOSPHATE 75 MG PO CAPS
75.0000 mg | ORAL_CAPSULE | Freq: Two times a day (BID) | ORAL | Status: DC
  Administered 2018-05-25: 75 mg via ORAL
  Filled 2018-05-25: qty 1

## 2018-05-25 MED ORDER — ONDANSETRON HCL 4 MG PO TABS: 4.0000 mg | ORAL_TABLET | Freq: Three times a day (TID) | ORAL | 0 refills | Status: DC | PRN

## 2018-05-25 NOTE — Discharge Instructions (Addendum)

## 2018-05-27 ENCOUNTER — Emergency Department (HOSPITAL_COMMUNITY)
Admission: EM | Admit: 2018-05-27 | Discharge: 2018-05-28 | Disposition: A | Discharge status: Home / Self Care | Payer: BLUE CROSS/BLUE SHIELD | Attending: Emergency Medicine | Admitting: Emergency Medicine

## 2018-05-27 ENCOUNTER — Encounter (HOSPITAL_COMMUNITY): Payer: Self-pay

## 2018-05-27 ENCOUNTER — Other Ambulatory Visit: Payer: Self-pay

## 2018-05-27 DIAGNOSIS — R509 Fever, unspecified: Secondary | ICD-10-CM | POA: Diagnosis present

## 2018-05-27 DIAGNOSIS — R112 Nausea with vomiting, unspecified: Secondary | ICD-10-CM | POA: Insufficient documentation

## 2018-05-27 DIAGNOSIS — J101 Influenza due to other identified influenza virus with other respiratory manifestations: Secondary | ICD-10-CM | POA: Diagnosis not present

## 2018-05-27 DIAGNOSIS — R0602 Shortness of breath: Secondary | ICD-10-CM | POA: Insufficient documentation

## 2018-05-27 MED ORDER — ONDANSETRON HCL 4 MG/2ML IJ SOLN
4.0000 mg | Freq: Once | INTRAMUSCULAR | Status: AC
  Administered 2018-05-28: 4 mg via INTRAVENOUS
  Filled 2018-05-27: qty 2

## 2018-05-27 MED ORDER — SODIUM CHLORIDE 0.9 % IV BOLUS
1000.0000 mL | Freq: Once | INTRAVENOUS | Status: AC
  Administered 2018-05-28: 1000 mL via INTRAVENOUS

## 2018-05-27 MED ORDER — ALBUTEROL SULFATE HFA 108 (90 BASE) MCG/ACT IN AERS
2.0000 | INHALATION_SPRAY | RESPIRATORY_TRACT | Status: DC | PRN
  Filled 2018-05-27: qty 6.7

## 2018-05-27 NOTE — ED Provider Notes (Signed)
[facility]-EMERGENCY DEPT Provider Note: Lowella Dell, MD, FACEP  CSN: [id number] MRN: [id number] ARRIVAL: 05/27/18 at 1849 ROOM: WA13/WA13   CHIEF COMPLAINT  Influenza   HISTORY OF PRESENT ILLNESS  05/27/18 11:43 PM Laura Potter is a 23 y.o. female with a 6-day history of flulike symptoms.  Specifically she has had subjective fever, chills, nasal congestion, scratchy throat, cough, shortness of breath, nausea, vomiting and diarrhea.  She was evaluated 3 days ago and diagnosed with influenza B.  She was placed on Tamiflu but nothing for nausea.  Her nausea and vomiting persists and she has had difficulty keeping anything down.  She is also having persistent cough with shortness of breath, worse with exertion.  She was noted to be tachycardic on arrival despite being afebrile.  She is also having dysphonia.    Past Medical History:  Diagnosis Date  . Bell's palsy   . Migraines     Past Surgical History:  Procedure Laterality Date  . MANDIBLE SURGERY     for underbite  . WISDOM TOOTH EXTRACTION  2012    Family History  Problem Relation Age of Onset  . Diabetes Mother   . High blood pressure Mother   . High blood pressure Father   . High blood pressure Maternal Grandmother   . Breast cancer Maternal Grandmother   . Stroke Maternal Grandmother   . Diabetes Maternal Grandfather   . High blood pressure Maternal Grandfather   . Diabetes Paternal Grandmother   . High blood pressure Paternal Grandmother   . Stroke Paternal Grandmother   . Migraines Paternal Grandmother   . Diabetes Paternal Grandfather   . High blood pressure Paternal Grandfather   . Breast cancer Other   . Healthy Brother   . Asthma Brother   . Healthy Brother   . Healthy Brother   . Bell's palsy Neg Hx     Social History   Tobacco Use  . Smoking status: Never Smoker  . Smokeless tobacco: Never Used  Substance Use Topics  . Alcohol use: Yes    Comment: occ  . Drug use: Yes    Frequency: 7.0 times  per week    Types: Marijuana    Prior to Admission medications   Medication Sig Start Date End Date Taking? Authorizing Provider  guaiFENesin (ROBITUSSIN) 100 MG/5ML SOLN Take 15 mLs by mouth every 4 (four) hours as needed for cough or to loosen phlegm.   Yes [provider]  ibuprofen (ADVIL,MOTRIN) 200 MG tablet Take 200 mg by mouth every 6 (six) hours as needed for fever, headache or moderate pain.   Yes [provider]  ondansetron (ZOFRAN) 4 MG tablet Take 1 tablet (4 mg total) by mouth every 8 (eight) hours as needed for nausea or vomiting. 05/25/18  Yes Arthor Captain, PA-C  oseltamivir (TAMIFLU) 75 MG capsule Take 1 capsule (75 mg total) by mouth every 12 (twelve) hours. 05/25/18  Yes Arthor Captain, PA-C  oseltamivir (TAMIFLU) 75 MG capsule Take 1 capsule (75 mg total) by mouth every 12 (twelve) hours. Patient not taking: Reported on 05/28/2018 05/25/18   Arthor Captain, PA-C    Allergies Patient has no known allergies.   REVIEW OF SYSTEMS  Negative except as noted here or in the History of Present Illness.   PHYSICAL EXAMINATION  Initial Vital Signs Blood pressure (!) 141/93, pulse (!) 128, temperature 98.2 F (36.8 C), temperature source Oral, resp. rate 16, height 6\' 1"  (1.854 m), weight 106 kg, SpO2 100 %.  Examination General: Well-developed, well-nourished female in no acute distress; appearance consistent with age of record HENT: normocephalic; atraumatic; congestion; dysphonia Eyes: pupils equal, round and reactive to light; extraocular muscles intact Neck: supple Heart: regular rate and rhythm; tachycardia Lungs: Decreased sounds left base Abdomen: soft; nondistended; mild diffuse tenderness; bowel sounds present Extremities: No deformity; full range of motion; pulses normal Neurologic: Awake, alert and oriented; motor function intact in all extremities and symmetric; no facial droop Skin: Warm and dry Psychiatric: Normal mood and  affect   RESULTS  Summary of this visit's results, reviewed by myself:   EKG Interpretation  Date/Time:    Ventricular Rate:    PR Interval:    QRS Duration:   QT Interval:    QTC Calculation:   R Axis:     Text Interpretation:        Laboratory Studies: No results found for this or any previous visit (from the past 24 hour(s)). Imaging Studies: Dg Chest 2 View  Result Date: 05/28/2018 CLINICAL DATA:  Flu for 1 week. Shortness of breath, chest congestion, and cough not improving. EXAM: CHEST - 2 VIEW COMPARISON:  05/07/2018 FINDINGS: The heart size and mediastinal contours are within normal limits. Both lungs are clear. The visualized skeletal structures are unremarkable. IMPRESSION: No active cardiopulmonary disease. Electronically Signed   By: Burman NievesWilliam  Stevens M.D.   On: 05/28/2018 00:20    ED COURSE and MDM  Nursing notes and initial vitals signs, including pulse oximetry, reviewed.  Vitals:   05/27/18 1903 05/28/18 0025 05/28/18 0320  BP: (!) 141/93 116/86 125/68  Pulse: (!) 128 (!) 105 (!) 109  Resp: 16 16 16   Temp: 98.2 F (36.8 C)  98.2 F (36.8 C)  TempSrc: Oral  Oral  SpO2: 100% 100% 98%  Weight: 106 kg    Height: 6\' 1"  (1.854 m)     3:47 AM Tachycardia improved after IV fluid bolus.  Drinking fluids after IV antiemetics.  Breathing improved after albuterol inhaler treatment.  PROCEDURES    ED DIAGNOSES     ICD-10-CM   1. Influenza B J10.1   2. Nausea and vomiting in adult R11.2        Paula LibraMolpus, Roquel Burgin, MD 05/28/18 [phone]

## 2018-05-27 NOTE — ED Triage Notes (Addendum)
Pt states that her flu symptoms have not resolved. Pt was dx on 1/10. Pt states that she is very winded walking around. Pt states she has been taking the medication prescribed without relief (tamiflu). Pt states she has been drinking additional fluids.

## 2018-05-28 ENCOUNTER — Emergency Department (HOSPITAL_COMMUNITY): Payer: BLUE CROSS/BLUE SHIELD

## 2018-05-28 MED ORDER — ONDANSETRON HCL 8 MG PO TABS: 4.0000 mg | ORAL_TABLET | Freq: Three times a day (TID) | ORAL | 0 refills | Status: DC | PRN

## 2018-05-28 MED ORDER — METOCLOPRAMIDE HCL 5 MG/ML IJ SOLN
10.0000 mg | Freq: Once | INTRAMUSCULAR | Status: AC
  Administered 2018-05-28: 10 mg via INTRAVENOUS
  Filled 2018-05-28: qty 2

## 2018-05-28 MED ORDER — IPRATROPIUM-ALBUTEROL 0.5-2.5 (3) MG/3ML IN SOLN
RESPIRATORY_TRACT | Status: AC
  Filled 2018-05-28: qty 3

## 2018-05-28 MED ORDER — KETOROLAC TROMETHAMINE 30 MG/ML IJ SOLN
15.0000 mg | Freq: Once | INTRAMUSCULAR | Status: AC
  Administered 2018-05-28: 15 mg via INTRAVENOUS
  Filled 2018-05-28: qty 1

## 2018-05-28 MED ORDER — AEROCHAMBER Z-STAT PLUS/MEDIUM MISC
1.0000 | Freq: Once | Status: AC
  Administered 2018-05-28: 1
  Filled 2018-05-28: qty 1

## 2018-05-28 MED ORDER — AEROCHAMBER PLUS FLO-VU MEDIUM MISC: 1.0000 | Freq: Once | Status: DC

## 2018-05-28 MED ORDER — LORAZEPAM 2 MG/ML IJ SOLN
INTRAMUSCULAR | Status: AC
  Filled 2018-05-28: qty 1

## 2018-05-28 MED ORDER — AEROCHAMBER PLUS FLO-VU MISC
1.0000 | Freq: Once | Status: DC
  Filled 2018-05-28: qty 1

## 2018-05-28 NOTE — ED Notes (Signed)
Pt was given ice water for PO challenge per verbal order from MD.
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
## 2018-05-30 ENCOUNTER — Ambulatory Visit: Payer: BLUE CROSS/BLUE SHIELD | Admitting: Rheumatology

## 2018-06-10 ENCOUNTER — Ambulatory Visit: Payer: BLUE CROSS/BLUE SHIELD | Admitting: Rheumatology

## 2018-07-03 ENCOUNTER — Ambulatory Visit: Payer: BLUE CROSS/BLUE SHIELD | Admitting: Neurology

## 2018-08-05 ENCOUNTER — Encounter: Payer: Self-pay | Admitting: Rheumatology

## 2018-08-05 NOTE — Telephone Encounter (Signed)
Okay to give letter to work from home.

## 2018-09-04 ENCOUNTER — Telehealth: Payer: Self-pay | Admitting: Neurology

## 2018-09-04 NOTE — Telephone Encounter (Signed)
I called and spoke with the patient regarding changing her apt due to COVID-19. She agreed to change to a phone visit and gave consent to have it billed through insurance. I made her aware a nurse would be reaching out.

## 2018-09-05 ENCOUNTER — Encounter: Payer: Self-pay | Admitting: Neurology

## 2018-09-05 NOTE — Addendum Note (Signed)
Addended by: Bertram Savin on: 09/05/2018 10:24 AM   Modules accepted: Orders

## 2018-09-05 NOTE — Telephone Encounter (Addendum)
Pt returned my call.  COnfirmed pt using 2 identifiers. She provided update of her chart. When updating pregnancy/menstrual status, she had concerns about her menstrual status that she will discuss with OBGYN. These were not discussed. She stated that when she saw Dr. Corliss Skains, she was told that the doctor would reach out to Dr. Lucia Gaskins to discuss the referral. I advised the reason for referral was because her +ANA is not Dr. Trevor Mace area of expertise. Pt understands this will be discussed at her appt Monday.

## 2018-09-05 NOTE — Telephone Encounter (Signed)
Called pt & LVM (ok per DPR) asking for call back today if possible to update her chart in preparation for her appt with Dr. Lucia Gaskins on Monday. Left office number in message.

## 2018-09-09 ENCOUNTER — Other Ambulatory Visit: Payer: Self-pay

## 2018-09-09 ENCOUNTER — Encounter: Payer: Self-pay | Admitting: Neurology

## 2018-09-09 ENCOUNTER — Ambulatory Visit (INDEPENDENT_AMBULATORY_CARE_PROVIDER_SITE_OTHER): Payer: Self-pay | Admitting: Neurology

## 2018-09-09 DIAGNOSIS — R258 Other abnormal involuntary movements: Secondary | ICD-10-CM | POA: Insufficient documentation

## 2018-09-09 DIAGNOSIS — G510 Bell's palsy: Secondary | ICD-10-CM

## 2018-09-09 DIAGNOSIS — B009 Herpesviral infection, unspecified: Secondary | ICD-10-CM

## 2018-09-09 DIAGNOSIS — R29810 Facial weakness: Secondary | ICD-10-CM

## 2018-09-09 DIAGNOSIS — Z1159 Encounter for screening for other viral diseases: Secondary | ICD-10-CM

## 2018-09-09 NOTE — Progress Notes (Signed)
[facility]    Provider:  Dr Lucia GaskinsAhern Referring Provider: Jodell CiproAfatsawo, Abla D, NP Primary Care Physician:  Doesn't have one currently, no obgyn  CC:  Recurrent bells palsy  Virtual Visit via Telephone Note  I connected with Laura ForthBreonna Santori on 09/09/18 at  2:00 PM EDT by telephone and verified that I am speaking with the correct person using two identifiers.   I discussed the limitations, risks, security and privacy concerns of performing an evaluation and management service by telephone and the availability of in person appointments. I also discussed with the patient that there may be a patient responsible charge related to this service. The patient expressed understanding and agreed to proceed.  Interval history 09/09/2018: Recurrent Bell's Palsy (bilateral). Lab testing showed +ANA(1:160 speckled), ena ssb 5.8, followed up with Rheumatology.  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 No more recurrences. She is experiencing synkinesis. When she smiles her eye on that side closes and difficulty pursing lips. Discussed may be permanent, using botox near the eye, and physical therapy. She also needs to use condoms, see an obgyn for HSV2+ and further treatment/evaluation for + antibodies owever she has never had symptoms.   HPI:  Laura Potter is a 23 y.o. female here as requested by Dr. Darrelyn HillockAfatsawo for Bell's palsy. The first time she looked in the mirror and her face wasn't moving on the right side 2017. Treated with prednisone and lasted 6 weeks. 2nd time in 2018 she had it on the left side. Diagnosed and treated with steroids and lasted about 6 weeks. On September 2nd she was brushing her teeth and her tongue felt numb, again had Bell's Palsy on the right side. Before the incidents, she felt rundown but always run down, she works full time and is a Consulting civil engineerstudent, no known illnesses this time, she has decreased taste, no hearing difficulties, she is having dry eye on the right. But she can close the right  eye she doesn't have to wear a patch. The first 2 she felt recovered, this is taking longer to resolve, she took short-term disability from work and she is stressed out. She massages her face. No FHx of autoimmune disorders or neurologic disorders. She never gets mouth blisters.   Reviewed notes, labs and imaging from outside physicians, which showed:  Will request notes from pcp, not provided   Review of Systems: Patient complains of symptoms per HPI as well as the following symptoms: depression, racing thoughts. Pertinent negatives and positives per HPI. All others negative.   Social History   Socioeconomic History   Marital status: Single    Spouse name: Not on file   Number of children: 0   Years of education: 7418   Highest education level: Some college, no degree  Occupational History   Not on file  Social Needs   Financial resource strain: Not on file   Food insecurity:    Worry: Not on file    Inability: Not on file   Transportation needs:    Medical: Not on file    Non-medical: Not on file  Tobacco Use   Smoking status: Never Smoker   Smokeless tobacco: Never Used  Substance and Sexual Activity   Alcohol use: Yes    Comment: occ   Drug use: Yes    Frequency: 7.0 times per week    Types: Marijuana   Sexual activity: Yes    Partners: Female    Birth control/protection: None  Lifestyle   Physical activity:    Days per  week: Not on file    Minutes per session: Not on file   Stress: Not on file  Relationships   Social connections:    Talks on phone: Not on file    Gets together: Not on file    Attends religious service: Not on file    Active member of club or organization: Not on file    Attends meetings of clubs or organizations: Not on file    Relationship status: Not on file   Intimate partner violence:    Fear of current or ex partner: Not on file    Emotionally abused: Not on file    Physically abused: Not on file    Forced sexual  activity: Not on file  Other Topics Concern   Not on file  Social History Narrative   Lives at home with her rabbit pickles   Right handed   Caffeine: rarely    Family History  Problem Relation Age of Onset   Diabetes Mother    High blood pressure Mother    High blood pressure Father    High blood pressure Maternal Grandmother    Breast cancer Maternal Grandmother    Stroke Maternal Grandmother    Diabetes Maternal Grandfather    High blood pressure Maternal Grandfather    Diabetes Paternal Grandmother    High blood pressure Paternal Grandmother    Stroke Paternal Grandmother    Migraines Paternal Grandmother    Diabetes Paternal Grandfather    High blood pressure Paternal Grandfather    Breast cancer Other    Healthy Brother    Asthma Brother    Healthy Brother    Healthy Brother    Bell's palsy Neg Hx     Past Medical History:  Diagnosis Date   Bell's palsy    Migraines     Past Surgical History:  Procedure Laterality Date   MANDIBLE SURGERY     for underbite   WISDOM TOOTH EXTRACTION  2012    Current Outpatient Medications  Medication Sig Dispense Refill   ibuprofen (ADVIL,MOTRIN) 200 MG tablet Take 200 mg by mouth every 6 (six) hours as needed for fever, headache or moderate pain.     No current facility-administered medications for this visit.     Allergies as of 09/09/2018   (No Known Allergies)   PRIOR VISIT. Telephone encounter, no exam for this appointment  Vitals: There were no vitals taken for this visit. Last Weight:  Wt Readings from Last 1 Encounters:  09/05/18 240 lb (108.9 kg)   Last Height:   Ht Readings from Last 1 Encounters:  09/05/18 6\' 1"  (1.854 m)   Physical exam: Exam: Gen: NAD, conversant, well nourised, obese, well groomed                     CV: RRR, no MRG. No Carotid Bruits. No peripheral edema, warm, nontender Eyes: Conjunctivae clear without exudates or hemorrhage  Neuro: Detailed  Neurologic Exam  Speech:    Speech is normal; fluent and spontaneous with normal comprehension.  Cognition:    The patient is oriented to person, place, and time;     recent and remote memory intact;     language fluent;     normal attention, concentration,     fund of knowledge Cranial Nerves:    The pupils are equal, round, and reactive to light. The fundi are normal and spontaneous venous pulsations are present. Visual fields are full to finger confrontation. Extraocular movements  are intact. Trigeminal sensation is intact and the muscles of mastication are normal. Decreased blink on the right, mild right facial palsy and mild right decrease brow lift, retracted right lid. Decreased right eye closure. The palate elevates in the midline. Hearing intact. Voice is normal. Shoulder shrug is normal. The tongue has normal motion without fasciculations.   Coordination:    Normal finger to nose and heel to shin. Normal rapid alternating movements.   Gait:    Heel-toe and tandem gait are normal.   Motor Observation:    No asymmetry, no atrophy, and no involuntary movements noted. Tone:    Normal muscle tone.    Posture:    Posture is normal. normal erect    Strength:    Strength is V/V in the upper and lower limbs.      Sensation: intact to LT     Reflex Exam:  DTR's:    Deep tendon reflexes in the upper and lower extremities are normal bilaterally.   Toes:    The toes are downgoing bilaterally.   Clonus:    Clonus is absent.       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 Bells palsy 3 time in 3 years, currently on the right but has been on the left in the past. Very unusual case.  Bell's palsy recurs in less than 10% of patients in this patient has had Bell's palsy 3 times in 3 years.  No family history of Bell's palsy.  Causes that predisposed to recurrence of idiopathic facial palsy or not well known however associated with causes such as malignant  hypertension, diabetes, autoimmune disorders, pregnancy, fibrous dysplasia, Melkerson Rosenthal syndrome. Will order    - MRI of the brain w/wo contrast unremarkable  - Lab testing showed +ANA(1:160 speckled), ena ssb 5.8, followed up with Rheumatology.  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 No more recurrences since last appt 03/2018.   - She is experiencing synkinesis and mouth weakness as residual from bells palsy.Will send to physical therapy. Consider botox around the eye if needed.  - Paient has facial weakness from Bells palsy. Would like therapy to see if we can strengthen muscle or try facial exercises/stretching, unclear if this is PT or Speech referral. Will try PT referral first.  - She also needs to use condoms, see an obgyn for HSV2+ and further treatment/evaluation for + antibodies hwever she has never had symptoms. Happy to refer her if needed, she will email after she looks into her new insurance plan. This may be the cause of her Bells palsy, may need continuous anti-virals if has Bells Palsy episodes again, call clinic asap if another episode.  Orders Placed This Encounter  Procedures   Ambulatory referral to Physical Therapy   RTC as needed  Follow Up Instructions:    I discussed the assessment and treatment plan with the patient. The patient was provided an opportunity to ask questions and all were answered. The patient agreed with the plan and demonstrated an understanding of the instructions.   The patient was advised to call back or seek an in-person evaluation if the symptoms worsen or if the condition fails to improve as anticipated.  I provided 24 minutes of non-face-to-face time during this encounter.   Anson Fret, MD  Cc: None at this time, advised her to get a pcp and obgyn  Naomie Dean, MD  [facility] [address] [place], [place] [address]  Phone [phone] Fax [phone]

## 2018-09-16 ENCOUNTER — Ambulatory Visit: Payer: Self-pay | Attending: Nurse Practitioner | Admitting: Rehabilitative and Restorative Service Providers"

## 2018-11-23 ENCOUNTER — Telehealth: Payer: Self-pay | Admitting: Nurse Practitioner

## 2018-11-23 DIAGNOSIS — J029 Acute pharyngitis, unspecified: Secondary | ICD-10-CM

## 2018-11-23 NOTE — Progress Notes (Signed)
We are sorry that you are not feeling well.  Here is how we plan to help! Since your symptoms have not gotten severe, I do not feel that you need to be tested for covid at this time. Your symptoms indicate a likely viral infection (Pharyngitis).   Pharyngitis is inflammation in the back of the throat which can cause a sore throat, scratchiness and sometimes difficulty swallowing.   Pharyngitis is typically caused by a respiratory virus and will just run its course.  Please keep in mind that your symptoms could last up to 10 days.  For throat pain, we recommend over the counter oral pain relief medications such as acetaminophen or aspirin, or anti-inflammatory medications such as ibuprofen or naproxen sodium.  Topical treatments such as oral throat lozenges or sprays may be used as needed.  Avoid close contact with loved ones, especially the very young and elderly.  Remember to wash your hands thoroughly throughout the day as this is the number one way to prevent the spread of infection and wipe down door knobs and counters with disinfectant.  After careful review of your answers, I would not recommend and antibiotic for your condition.  Antibiotics should not be used to treat conditions that we suspect are caused by viruses like the virus that causes the common cold or flu. However, some people can have Strep with atypical symptoms. You may need formal testing in clinic or office to confirm if your symptoms continue or worsen.  Providers prescribe antibiotics to treat infections caused by bacteria. Antibiotics are very powerful in treating bacterial infections when they are used properly.  To maintain their effectiveness, they should be used only when necessary.  Overuse of antibiotics has resulted in the development of super bugs that are resistant to treatment!    Home Care:  Only take medications as instructed by your medical team.  Do not drink alcohol while taking these medications.  A steam or  ultrasonic humidifier can help congestion.  You can place a towel over your head and breathe in the steam from hot water coming from a faucet.  Avoid close contacts especially the very young and the elderly.  Cover your mouth when you cough or sneeze.  Always remember to wash your hands.  Get Help Right Away If:  You develop worsening fever or throat pain.  You develop a severe head ache or visual changes.  Your symptoms persist after you have completed your treatment plan.  Make sure you  Understand these instructions.  Will watch your condition.  Will get help right away if you are not doing well or get worse.  Your e-visit answers were reviewed by a board certified advanced clinical practitioner to complete your personal care plan.  Depending on the condition, your plan could have included both over the counter or prescription medications.  If there is a problem please reply  once you have received a response from your provider.  Your safety is important to [place].  If you have drug allergies check your prescription carefully.    You can use MyChart to ask questions about todays visit, request a non-urgent call back, or ask for a work or school excuse for 24 hours related to this e-Visit. If it has been greater than 24 hours you will need to follow up with your provider, or enter a new e-Visit to address those concerns.  You will get an e-mail in the next two days asking about your experience.  I hope that  your e-visit has been valuable and will speed your recovery. Thank you for using e-visits.   5-10 minutes spent reviewing and documenting in chart.

## 2018-11-28 ENCOUNTER — Encounter: Payer: Self-pay | Admitting: Rheumatology

## 2018-12-02 ENCOUNTER — Other Ambulatory Visit: Payer: Self-pay | Admitting: Neurology

## 2018-12-02 MED ORDER — VALACYCLOVIR HCL 1 G PO TABS: 1000.0000 mg | ORAL_TABLET | Freq: Two times a day (BID) | ORAL | 1 refills | Status: AC

## 2018-12-10 ENCOUNTER — Telehealth: Payer: Self-pay | Admitting: Family Medicine

## 2018-12-11 ENCOUNTER — Ambulatory Visit: Payer: Self-pay | Admitting: Family Medicine
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
